# Patient Record
Sex: Male | Born: 1965 | Race: Black or African American | Hispanic: No | Marital: Married | Smoking: Never smoker
Health system: Southern US, Community
[De-identification: ages and names within clinical notes are randomized; demographics above are authoritative.]

## PROBLEM LIST (undated history)

## (undated) DIAGNOSIS — T7840XA Allergy, unspecified, initial encounter: Secondary | ICD-10-CM

## (undated) DIAGNOSIS — J329 Chronic sinusitis, unspecified: Secondary | ICD-10-CM

## (undated) HISTORY — PX: ORCHIOPEXY: SHX479

## (undated) HISTORY — DX: Allergy, unspecified, initial encounter: T78.40XA

## (undated) HISTORY — DX: Chronic sinusitis, unspecified: J32.9

---

## 2007-05-14 ENCOUNTER — Emergency Department (HOSPITAL_COMMUNITY): Admission: EM | Admit: 2007-05-14 | Discharge: 2007-05-14 | Payer: Self-pay | Admitting: Emergency Medicine

## 2011-04-21 ENCOUNTER — Ambulatory Visit (INDEPENDENT_AMBULATORY_CARE_PROVIDER_SITE_OTHER): Payer: 59 | Admitting: Family Medicine

## 2011-04-21 DIAGNOSIS — J309 Allergic rhinitis, unspecified: Secondary | ICD-10-CM

## 2011-04-21 DIAGNOSIS — J329 Chronic sinusitis, unspecified: Secondary | ICD-10-CM

## 2011-04-21 MED ORDER — IPRATROPIUM BROMIDE 0.03 % NA SOLN: 2.0000 | Freq: Two times a day (BID) | NASAL | Status: DC

## 2011-04-21 NOTE — Progress Notes (Signed)
  [facility]:  Office Visit  Chief Complaint:  Chief Complaint  Patient presents with  . Sore Throat    symptoms since thurs  . Cough    dry  . Headache    HPI: Michael Barry is a 46 y.o. male who complains of 3 day history of  HA, sinus congestion, sore throat, rhinorrhea, cough is clear mucus,  took Zyrtec without relief.  Past Medical History  Diagnosis Date  . Sinus infection    History reviewed. No pertinent past surgical history. History   Social History  . Marital Status: Legally Separated    Spouse Name: N/A    Number of Children: N/A  . Years of Education: N/A   Social History Main Topics  . Smoking status: Never Smoker   . Smokeless tobacco: None  . Alcohol Use: No  . Drug Use: No  . Sexually Active: None   Other Topics Concern  . None   Social History Narrative  . None   Family History  Problem Relation Age of Onset  . Diabetes Mother    No Known Allergies Prior to Admission medications   Medication Sig Start Date End Date Taking? Authorizing Provider  cetirizine (ZYRTEC) 10 MG tablet Take 10 mg by mouth daily.   Yes Historical Provider, MD     ROS: The patient denies fevers, chills, night sweats, unintentional weight loss, chest pain, palpitations, wheezing, dyspnea on exertion, nausea, vomiting, abdominal pain, dysuria, hematuria, melena, numbness, weakness, or tingling. + cough, sinus HA  All other systems have been reviewed and were otherwise negative with the exception of those mentioned in the HPI and as above.    PHYSICAL EXAM: Filed Vitals:   04/21/11 1032  BP: 124/80  Pulse: 78  Temp: 98.2 F (36.8 C)  Resp: 16   Filed Vitals:   04/21/11 1032  Height: 5' 4.5" (1.638 m)  Weight: 164 lb 9.6 oz (74.662 kg)   Body mass index is 27.82 kg/(m^2).  General: Alert, no acute distress HEENT:  Normocephalic, atraumatic, oropharynx patent. TM nl. + sinus tenderness frontal bilateral, nose boggy red inflamed.    Cardiovascular:  Regular rate and rhythm, no rubs murmurs or gallops.  No Carotid bruits, radial pulse intact. No pedal edema.  Respiratory: Clear to auscultation bilaterally.  No wheezes, rales, or rhonchi.  No cyanosis, no use of accessory musculature GI: No organomegaly, abdomen is soft and non-tender, positive bowel sounds.  No masses. Skin: No rashes. Neurologic: Facial musculature symmetric. Psychiatric: Patient is appropriate throughout our interaction. Lymphatic: No cervical lymphadenopathy Musculoskeletal: Gait intact.   LABS: No results found for this or any previous visit.   EKG/XRAY:   Primary read interpreted by Dr. Conley Rolls at [facility].   ASSESSMENT/PLAN: Encounter Diagnoses  Name Primary?  . Allergic rhinitis Yes  . Sinusitis    Atrovent NS C/w Zyrtec Gave a rx for Z-pack. Advise not to take until 5 days from now if sxs worsen. Fill date is for 04/26/11. He was given this due to h/o sinus infection.    Shantea Poulton PHUONG, DO 04/21/2011 11:21 AM

## 2011-04-27 ENCOUNTER — Ambulatory Visit (INDEPENDENT_AMBULATORY_CARE_PROVIDER_SITE_OTHER): Payer: 59 | Admitting: Family Medicine

## 2011-04-27 DIAGNOSIS — J329 Chronic sinusitis, unspecified: Secondary | ICD-10-CM

## 2011-04-27 DIAGNOSIS — J301 Allergic rhinitis due to pollen: Secondary | ICD-10-CM

## 2011-04-27 DIAGNOSIS — J019 Acute sinusitis, unspecified: Secondary | ICD-10-CM

## 2011-04-27 DIAGNOSIS — H1010 Acute atopic conjunctivitis, unspecified eye: Secondary | ICD-10-CM

## 2011-04-27 DIAGNOSIS — L309 Dermatitis, unspecified: Secondary | ICD-10-CM

## 2011-04-27 DIAGNOSIS — H1045 Other chronic allergic conjunctivitis: Secondary | ICD-10-CM

## 2011-04-27 DIAGNOSIS — L259 Unspecified contact dermatitis, unspecified cause: Secondary | ICD-10-CM

## 2011-04-27 MED ORDER — TRIAMCINOLONE ACETONIDE 0.1 % EX CREA: TOPICAL_CREAM | Freq: Two times a day (BID) | CUTANEOUS | Status: AC

## 2011-04-27 MED ORDER — METHYLPREDNISOLONE ACETATE 80 MG/ML IJ SUSP
80.0000 mg | Freq: Once | INTRAMUSCULAR | Status: AC
  Administered 2011-04-27: 80 mg via INTRAMUSCULAR

## 2011-04-27 MED ORDER — LEVOFLOXACIN 500 MG PO TABS: 500.0000 mg | ORAL_TABLET | Freq: Every day | ORAL | Status: AC

## 2011-04-27 NOTE — Progress Notes (Signed)
  Subjective:    Patient ID: Michael Barry, male    DOB: 10-01-1965, 46 y.o.   MRN: [id number]  HPI  Patient presents complaining of  Itchy eyes Nasal stuffiness (L) sided facial pain and swelling Pruritic rash on his neck  Atrovent nasal spray no longer effective  Review of Systems  Constitutional: Negative for fever and chills.  HENT: Positive for congestion. Negative for ear pain.   Respiratory: Positive for cough. Negative for wheezing.   Skin: Positive for rash.       Objective:   Physical Exam  Constitutional: He appears well-developed.  HENT:  Nose: Mucosal edema (turbinates swollen and essentially occlude nasal passage way) present. Left sinus exhibits maxillary sinus tenderness and frontal sinus tenderness.  Cardiovascular: Normal rate, regular rhythm and normal heart sounds.   Pulmonary/Chest: Effort normal and breath sounds normal.  Lymphadenopathy:    He has cervical adenopathy.  Neurological: He is alert.  Skin: Skin is warm. Rash (eczematous hyperpigmented area posterior nape) noted.          Assessment & Plan:   1. Allergic rhinitis  methylPREDNISolone acetate (DEPO-MEDROL) injection 80 mg  2. Conjunctivitis, allergic    3. Sinusitis  levofloxacin (LEVAQUIN) 500 MG tablet  4. Eczema  triamcinolone cream (KENALOG) 0.1 %

## 2011-10-26 ENCOUNTER — Ambulatory Visit (INDEPENDENT_AMBULATORY_CARE_PROVIDER_SITE_OTHER): Payer: 59 | Admitting: Family Medicine

## 2011-10-26 VITALS — BP 112/74 | HR 69 | Temp 97.6°F | Resp 16 | Ht 64.0 in | Wt 163.0 lb

## 2011-10-26 DIAGNOSIS — J309 Allergic rhinitis, unspecified: Secondary | ICD-10-CM

## 2011-10-26 DIAGNOSIS — J3489 Other specified disorders of nose and nasal sinuses: Secondary | ICD-10-CM

## 2011-10-26 MED ORDER — CEFDINIR 300 MG PO CAPS: 300.0000 mg | ORAL_CAPSULE | Freq: Two times a day (BID) | ORAL | Status: DC

## 2011-10-26 MED ORDER — METHYLPREDNISOLONE ACETATE 80 MG/ML IJ SUSP
80.0000 mg | Freq: Once | INTRAMUSCULAR | Status: AC
  Administered 2011-10-26: 80 mg via INTRAMUSCULAR

## 2011-10-26 NOTE — Progress Notes (Signed)
[facility] [address] [place] [address] [phone]  Date:  10/26/2011   Name:  Michael Barry   DOB:  1965-06-01   MRN:  [id number]  PCP:  No primary provider on file.    Chief Complaint: Sinusitis   History of Present Illness:  Michael Barry is a 46 y.o. very pleasant male patient who presents with the following:  He is here today with a concern about his sinuses.  He notes that his nose is running, and he has congestion and pain in his sinuses. He has had this problem for the last 2 or 3 days  He has a mild cough.   No fever, chills or body aches.   He does note a ST but no earache.  No GI symptoms.   He tends to have this problem "twice a year."  He is using zyrtec but is not getting relief.   He was helped by a shot of steroids in the spring of this year- it helped " a lot."  He would like to have this again if possible.   Otherwise he is generally healthy and does not take any chronic medications  There is no problem list on file for this patient.   Past Medical History  Diagnosis Date  . Sinus infection   . Allergy     No past surgical history on file.  History  Substance Use Topics  . Smoking status: Never Smoker   . Smokeless tobacco: Not on file  . Alcohol Use: No    Family History  Problem Relation Age of Onset  . Diabetes Mother     No Known Allergies  Medication list has been reviewed and updated.  Current Outpatient Prescriptions on File Prior to Visit  Medication Sig Dispense Refill  . cetirizine (ZYRTEC) 10 MG tablet Take 10 mg by mouth daily.      [name] ipratropium (ATROVENT) 0.03 % nasal spray Place 2 sprays into the nose every 12 (twelve) hours.  30 mL  1  . triamcinolone cream (KENALOG) 0.1 % Apply topically 2 (two) times daily.  30 g  0    Review of Systems:  As per HPI- otherwise negative.   Physical Examination: Filed Vitals:   10/26/11 1407  BP: 112/74  Pulse: 69  Temp: 97.6 F (36.4 C)  Resp:  16   Filed Vitals:   10/26/11 1407  Height: 5\' 4"  (1.626 m)  Weight: 163 lb (73.936 kg)   Body mass index is 27.98 kg/(m^2). Ideal Body Weight: Weight in (lb) to have BMI = 25: 145.3   GEN: WDWN, NAD, Non-toxic, A & O x 3 HEENT: Atraumatic, Normocephalic. Neck supple. No masses, No LAD.  Bilateral TM wnl, oropharynx normal.  PEERL,EOMI.   His left nasal cavity is occluded by swollen turbinates and congestion.  Right nasal cavity is congested.   Ears and Nose: No external deformity. CV: RRR, No M/G/R. No JVD. No thrill. No extra heart sounds. PULM: CTA B, no wheezes, crackles, rhonchi. No retractions. No resp. distress. No accessory muscle use. ABD: S, NT, ND EXTR: No c/c/e NEURO Normal gait.  PSYCH: Normally interactive. Conversant. Not depressed or anxious appearing.  Calm demeanor.    Assessment and Plan: 1. Allergic rhinitis  methylPREDNISolone acetate (DEPO-MEDROL) injection 80 mg  2. Sinus pain  cefdinir (OMNICEF) 300 MG capsule   Likely severe AR- he has responded well to depo- medrol in the past and his symptoms are severe, so will  give him a shot of this today Hold omnicef as I suspect the he does not have an infection- however, he can start the antibiotic if not better in the next few days.  See pt instructions.    Abbe Amsterdam, MD

## 2011-10-26 NOTE — Patient Instructions (Addendum)
I think that you have allergies, not an infection.  However, if you are not better in the next 2 or 3 days fill and use the [facility] prescription.  Let me know if you are still not better after that- Sooner if worse.

## 2012-04-23 ENCOUNTER — Ambulatory Visit (INDEPENDENT_AMBULATORY_CARE_PROVIDER_SITE_OTHER): Payer: 59 | Admitting: Family Medicine

## 2012-04-23 VITALS — BP 122/80 | HR 68 | Temp 97.7°F | Resp 16 | Ht 66.0 in | Wt 162.0 lb

## 2012-04-23 DIAGNOSIS — J309 Allergic rhinitis, unspecified: Secondary | ICD-10-CM

## 2012-04-23 MED ORDER — METHYLPREDNISOLONE ACETATE 80 MG/ML IJ SUSP
80.0000 mg | Freq: Once | INTRAMUSCULAR | Status: AC
  Administered 2012-04-23: 80 mg via INTRAMUSCULAR

## 2012-04-23 MED ORDER — FLUTICASONE PROPIONATE 50 MCG/ACT NA SUSP: 2.0000 | Freq: Every day | NASAL | Status: DC

## 2012-04-23 NOTE — Patient Instructions (Addendum)
We are going to refer you to an allergist for further care of your chronic allergies.  You can buy some more zyrtec OTC and take one a day.  Use the nasal spray once a day as well.  Let me know if you are not better in the next few days.

## 2012-04-23 NOTE — Progress Notes (Signed)
[facility] [address] [place] [address] [phone]  Date:  04/23/2012   Name:  Michael Barry   DOB:  19-Apr-1965   MRN:  [id number]  PCP:  No primary provider on file.    Chief Complaint: Allergies   History of Present Illness:  Michael Barry is a 47 y.o. very pleasant male patient who presents with the following:  Seen around this time last year with allergies which were non- responsive to Atrovent NS.  He was treated with IM depo- medrol.  Was here again in the fall and received the same thing. He always get a good response with the depo- medrol.   He is here today with "the same thing every year, but this year it's the worst."   He has noted symptoms for about 4 days.  He has a lot of nasal and sinus congestion, and he feels a lot of pressure in his sinuses. He has pain in his sinuses.  In the morning his eyes are very swollen.  He feels terrible.   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   + sneezing No cough He has not noted a fever, chills, body aches No GI symptoms.     He is otherwise generally healthy There is no problem list on file for this patient.   Past Medical History  Diagnosis Date  . Sinus infection   . Allergy     History reviewed. No pertinent past surgical history.  History  Substance Use Topics  . Smoking status: Never Smoker   . Smokeless tobacco: Not on file  . Alcohol Use: No    Family History  Problem Relation Age of Onset  . Diabetes Mother     No Known Allergies  Medication list has been reviewed and updated.  Current Outpatient Prescriptions on File Prior to Visit  Medication Sig Dispense Refill  . cetirizine (ZYRTEC) 10 MG tablet Take 10 mg by mouth daily.      . cefdinir (OMNICEF) 300 MG capsule Take 1 capsule (300 mg total) by mouth 2 (two) times daily.  20 capsule  0  . ipratropium (ATROVENT) 0.03 % nasal spray Place 2 sprays into the nose every 12 (twelve)  hours.  30 mL  1  . triamcinolone cream (KENALOG) 0.1 % Apply topically 2 (two) times daily.  30 g  0   No current facility-administered medications on file prior to visit.    Review of Systems:  As per HPI- otherwise negative.   Physical Examination: Filed Vitals:   04/23/12 0803  BP: 122/80  Pulse: 68  Temp: 97.7 F (36.5 C)  Resp: 16   Filed Vitals:   04/23/12 0803  Height: 5\' 6"  (1.676 m)  Weight: 162 lb (73.483 kg)   Body mass index is 26.16 kg/(m^2). Ideal Body Weight: Weight in (lb) to have BMI = 25: 154.6  GEN: WDWN, NAD, Non-toxic, A & O x 3 HEENT: Atraumatic, Normocephalic. Neck supple. No masses, No LAD.  Bilateral TM wnl, oropharynx normal.  PEERL,EOMI.   Nasal cavity is congested Ears and Nose: No external deformity. CV: RRR, No M/G/R. No JVD. No thrill. No extra heart sounds. PULM: CTA B, no wheezes, crackles, rhonchi. No retractions. No resp. distress. No accessory muscle use. EXTR: No c/c/e NEURO Normal gait.  PSYCH: Normally interactive. Conversant. Not depressed or anxious appearing.  Calm demeanor.    Assessment and Plan: Allergic rhinitis - Plan:  fluticasone (FLONASE) 50 MCG/ACT nasal spray, methylPREDNISolone acetate (DEPO-MEDROL) injection 80 mg, Ambulatory referral to Allergy  Referral to allergy- as we would like to avoid having to use depo- medrol twice a year.  He would like to see an allergist.  In the meantime will use zyrtec and flonase, let me know if not better sppm  Signed Abbe Amsterdam, MD

## 2012-05-30 ENCOUNTER — Ambulatory Visit (INDEPENDENT_AMBULATORY_CARE_PROVIDER_SITE_OTHER): Payer: 59 | Admitting: Family Medicine

## 2012-05-30 VITALS — BP 124/74 | HR 83 | Temp 98.0°F | Resp 16 | Ht 64.25 in | Wt 160.6 lb

## 2012-05-30 DIAGNOSIS — Z111 Encounter for screening for respiratory tuberculosis: Secondary | ICD-10-CM

## 2012-05-30 DIAGNOSIS — Z0000 Encounter for general adult medical examination without abnormal findings: Secondary | ICD-10-CM

## 2012-05-30 DIAGNOSIS — Z8042 Family history of malignant neoplasm of prostate: Secondary | ICD-10-CM

## 2012-05-30 LAB — POCT CBC
Granulocyte percent: 50.2 %G (ref 37–80)
HCT, POC: 47.7 % (ref 43.5–53.7)
Hemoglobin: 15 g/dL (ref 14.1–18.1)
Lymph, poc: 2.2 (ref 0.6–3.4)
MCH, POC: 25.7 pg — AB (ref 27–31.2)
MCHC: 31.4 g/dL — AB (ref 31.8–35.4)
MCV: 81.8 fL (ref 80–97)
MID (cbc): 0.3 (ref 0–0.9)
MPV: 8.1 fL (ref 0–99.8)
POC Granulocyte: 2.6 (ref 2–6.9)
POC LYMPH PERCENT: 43.3 %L (ref 10–50)
POC MID %: 6.5 % (ref 0–12)
Platelet Count, POC: 193 10*3/uL (ref 142–424)
RBC: 5.83 M/uL (ref 4.69–6.13)
RDW, POC: 15.3 %
WBC: 5.1 10*3/uL (ref 4.6–10.2)

## 2012-05-30 LAB — COMPREHENSIVE METABOLIC PANEL
AST: 20 U/L (ref 0–37)
Albumin: 4.4 g/dL (ref 3.5–5.2)
Alkaline Phosphatase: 62 U/L (ref 39–117)
Calcium: 9.5 mg/dL (ref 8.4–10.5)
Chloride: 105 mEq/L (ref 96–112)
Glucose, Bld: 69 mg/dL — ABNORMAL LOW (ref 70–99)
Potassium: 4 mEq/L (ref 3.5–5.3)
Sodium: 137 mEq/L (ref 135–145)
Total Protein: 7.1 g/dL (ref 6.0–8.3)

## 2012-05-30 LAB — COMPREHENSIVE METABOLIC PANEL WITH GFR
ALT: 32 U/L (ref 0–53)
BUN: 8 mg/dL (ref 6–23)
CO2: 26 meq/L (ref 19–32)
Creat: 0.85 mg/dL (ref 0.50–1.35)
Total Bilirubin: 0.4 mg/dL (ref 0.3–1.2)

## 2012-05-30 LAB — LDL CHOLESTEROL, DIRECT: Direct LDL: 65 mg/dL

## 2012-05-30 NOTE — Progress Notes (Signed)
[facility]:  Office Visit  Chief Complaint:  Chief Complaint  Patient presents with  . Annual Exam    HPI: Michael Barry is a 47 y.o. male who complains of  Here for and TB test. 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  He is from [place] He was a HS [place] No complaints Last PE-? Does not do regular testicular  Father with prostate cancer at age 81, died at age 47   Past Medical History  Diagnosis Date  . Sinus infection   . Allergy    No past surgical history on file. History   Social History  . Marital Status: Married    Spouse Name: N/A    Number of Children: N/A  . Years of Education: N/A   Social History Main Topics  . Smoking status: Never Smoker   . Smokeless tobacco: None  . Alcohol Use: No  . Drug Use: No  . Sexually Active: Yes    Birth Control/ Protection: None   Other Topics Concern  . None   Social History Narrative  . None   Family History  Problem Relation Age of Onset  . Diabetes Mother    No Known Allergies Prior to Admission medications   Medication Sig Start Date End Date Taking? Authorizing Provider  cetirizine (ZYRTEC) 10 MG tablet Take 10 mg by mouth daily.   Yes Historical Provider, MD  fluticasone (FLONASE) 50 MCG/ACT nasal spray Place 2 sprays into the nose daily. 04/23/12  Yes Jessica C Copland, MD  ipratropium (ATROVENT) 0.03 % nasal spray Place 2 sprays into the nose every 12 (twelve) hours. 04/21/11 04/20/12  Thao P Le, DO     ROS: The patient denies fevers, chills, night sweats, unintentional weight loss, chest pain, palpitations, wheezing, dyspnea on exertion, nausea, vomiting, abdominal pain, dysuria, hematuria, melena, numbness, weakness, or tingling.   All other systems have been reviewed and were otherwise negative with the exception of those mentioned in the HPI and as above.    PHYSICAL EXAM: Filed Vitals:   05/30/12 1424  BP: 124/74  Pulse: 83  Temp: 98 F (36.7 C)  Resp: 16   Filed  Vitals:   05/30/12 1424  Height: 5' 4.25" (1.632 m)  Weight: 160 lb 9.6 oz (72.848 kg)   Body mass index is 27.35 kg/(m^2).  General: Alert, no acute distress HEENT:  Normocephalic, atraumatic, oropharynx patent. EOMI, PERRLA, fundoscopic exam nl Cardiovascular:  Regular rate and rhythm, no rubs murmurs or gallops.  No Carotid bruits, radial pulse intact. No pedal edema.  Respiratory: Clear to auscultation bilaterally.  No wheezes, rales, or rhonchi.  No cyanosis, no use of accessory musculature GI: No organomegaly, abdomen is soft and non-tender, positive bowel sounds.  No masses. Skin: No rashes. Neurologic: Facial musculature symmetric. Psychiatric: Patient is appropriate throughout our interaction. Lymphatic: No cervical lymphadenopathy Musculoskeletal: Gait intact. 5/5 strength, no scoliosis, 2/2 DTrs sensation intact.  GU-no inguinal hernia, testicles normal.    LABS: Results for orders placed in visit on 05/30/12  POCT CBC      Result Value Range   WBC 5.1  4.6 - 10.2 K/uL   Lymph, poc 2.2  0.6 - 3.4   POC LYMPH PERCENT 43.3  10 - 50 %L   MID (cbc) 0.3  0 - 0.9   POC MID % 6.5  0 - 12 %M   POC Granulocyte 2.6  2 - 6.9   Granulocyte percent 50.2  37 - 80 %  G   RBC 5.83  4.69 - 6.13 M/uL   Hemoglobin 15.0  14.1 - 18.1 g/dL   HCT, POC 16.1  09.6 - 53.7 %   MCV 81.8  80 - 97 fL   MCH, POC 25.7 (*) 27 - 31.2 pg   MCHC 31.4 (*) 31.8 - 35.4 g/dL   RDW, POC 04.5     Platelet Count, POC 193  142 - 424 K/uL   MPV 8.1  0 - 99.8 fL     EKG/XRAY:   Primary read interpreted by Dr. Conley Rolls at [facility].   ASSESSMENT/PLAN: Encounter Diagnoses  Name Primary?  . Annual physical exam Yes  . Family history of prostate cancer   . PPD screening test    Labs pending Patient will get Interferon Gold since has had BCG vaccine F/u prn    LE, THAO PHUONG, DO 05/30/2012 3:39 PM

## 2012-05-31 LAB — PSA: PSA: 0.65 ng/mL (ref <=4.00)

## 2012-06-02 ENCOUNTER — Telehealth: Payer: Self-pay | Admitting: Family Medicine

## 2012-06-02 ENCOUNTER — Encounter (INDEPENDENT_AMBULATORY_CARE_PROVIDER_SITE_OTHER): Payer: 59

## 2012-06-02 DIAGNOSIS — B330 Epidemic myalgia: Secondary | ICD-10-CM

## 2012-06-02 DIAGNOSIS — R899 Unspecified abnormal finding in specimens from other organs, systems and tissues: Secondary | ICD-10-CM

## 2012-06-02 LAB — QUANTIFERON TB GOLD ASSAY (BLOOD)
Interferon Gamma Release Assay: POSITIVE — AB
Mitogen value: 9.28 [IU]/mL
Quantiferon Nil Value: 0.16 [IU]/mL
Quantiferon Tb Ag Minus Nil Value: 1.68 [IU]/mL
TB Ag value: 1.84 [IU]/mL

## 2012-06-02 NOTE — Telephone Encounter (Signed)
Lm regarding Interferon gold test. Will get chest xray on patient. He will come in for just that, He denies any symptoms.

## 2012-06-02 NOTE — Telephone Encounter (Addendum)
Discussed lab results with patient, interferon gold pending.Michael KitchenMarland KitchenBut  Test result came back +, he is completely asymptomatic: no fevers, chills, weightloss, nightsweats, cough, recent travels He was vaccinated with BCG in his home country. He did not tell me that he was treated for TB on our OV He was treated for TB in 2002 or 2003 at the [facility] I have advised him to return for Chest xray only, that has been ordered. He will get me the records from the Health Departement.

## 2012-06-03 ENCOUNTER — Ambulatory Visit (INDEPENDENT_AMBULATORY_CARE_PROVIDER_SITE_OTHER): Payer: 59 | Admitting: Internal Medicine

## 2012-06-03 ENCOUNTER — Ambulatory Visit: Payer: 59

## 2012-06-03 VITALS — BP 120/85 | HR 77 | Temp 98.2°F | Resp 18 | Wt 159.0 lb

## 2012-06-03 DIAGNOSIS — Z111 Encounter for screening for respiratory tuberculosis: Secondary | ICD-10-CM

## 2012-06-03 DIAGNOSIS — R899 Unspecified abnormal finding in specimens from other organs, systems and tissues: Secondary | ICD-10-CM

## 2012-06-03 DIAGNOSIS — R6889 Other general symptoms and signs: Secondary | ICD-10-CM

## 2012-06-03 NOTE — Patient Instructions (Addendum)
Your chest x-ray looks ok today.  I will send you a copy of the radiology report when it is available.  Please bring [place] copies of your records for the health department.

## 2012-06-03 NOTE — Progress Notes (Signed)
TB SCREENING in patient with history of POSITIVE reaction to TB Skin Test  Has the patient experienced any of the following in the last 12 months? Unexplained productive cough no Unexplained weight loss no Unexplained appetite loss no Unexplained fever no  Night sweats no Shortness of breath no   Chest pain no Increased fatigue no Exposure to Tuberculosis no Development of Diabetes no 

## 2012-06-03 NOTE — Progress Notes (Signed)
  Subjective:    Patient ID: Michael Barry, male    DOB: Mar 24, 1965, 47 y.o.   MRN: [id number]    HPI   Michael Barry is a pleasant 47 yr old male here for CXR.  Previous notes reviewed.  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  At last visit pt reported a history BCG vaccine as a child, and therefore TB quantiferon gold was ordered, which subsequently came back positive.  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  He is asymptomatic - denies fever, chills, weight loss, night sweats, cough, SOB.   Review of Systems  All other systems reviewed and are negative.       Objective:   Physical Exam  Vitals reviewed. Constitutional: He is oriented to person, place, and time. He appears well-developed and well-nourished. No distress.  HENT:  Head: Normocephalic and atraumatic.  Eyes: Conjunctivae are normal. No scleral icterus.  Pulmonary/Chest: Effort normal.  Neurological: He is alert and oriented to person, place, and time.  Skin: Skin is warm and dry.  Psychiatric: He has a normal mood and affect. His behavior is normal.     [facility] reading (PRIMARY) by  Dr. Perrin Maltese - negative       Assessment & Plan:  Screening for tuberculosis - Plan: DG Chest 2 View  Abnormal laboratory test - Plan: DG Chest 2 View, DG Chest 2 View   Michael Barry is a 47 yr old male here requiring CXR for TB screening.  He has a history of BCG vaccine, also treated for LTBI, now with positive quantiferon gold.  Manufacturer information for quantiferon gold indicates that the test may remain positive after treatment for LTBI, which may account for pt's positive result.  CXR today is negative.  Await radiology final report, then will print copy for pt's records.  Encouraged him to obtain a copy of his records from the health department for our review.

## 2012-06-06 NOTE — Telephone Encounter (Signed)
Dr,Lee, Pt states that he was told to bring results from tx for tb 2003, pt would like to discuss this with you.   best# [phone]

## 2012-06-06 NOTE — Telephone Encounter (Signed)
Spoke with patient, reviewed records from [facility]. 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 Scanned paper from HD into system. I have written a letter stating he has no active or latent TB at this time. CXR results are negtive for active diseases as well.

## 2013-07-03 ENCOUNTER — Ambulatory Visit (INDEPENDENT_AMBULATORY_CARE_PROVIDER_SITE_OTHER): Payer: 59 | Admitting: Physician Assistant

## 2013-07-03 VITALS — BP 120/72 | HR 72 | Temp 97.9°F | Resp 16 | Ht 63.0 in | Wt 159.4 lb

## 2013-07-03 DIAGNOSIS — Z113 Encounter for screening for infections with a predominantly sexual mode of transmission: Secondary | ICD-10-CM

## 2013-07-03 NOTE — Progress Notes (Signed)
   Subjective:    Patient ID: Michael Barry, male    DOB: May 06, 1965, 48 y.o.   MRN: [id number]   PCP: No PCP Per Patient  Chief Complaint  Patient presents with  . Exposure to STD    wants to be tested for herpes    Medications, allergies, past medical history, surgical history, family history, social history and problem list reviewed and updated.  HPI  Wife was recently diagnoses with HSV-2 by PCR of swab of a vaginal lesion. He would like to be tested as well.  He has no lesions now, and denies history of any bumps or blisters or ulcers in the genital region. He denies any other sexual partners.   Review of Systems     Objective:   Physical Exam  BP 120/72  Pulse 72  Temp(Src) 97.9 F (36.6 C) (Oral)  Resp 16  Ht 5\' 3"  (1.6 m)  Wt 159 lb 6.4 oz (72.303 kg)  BMI 28.24 kg/m2  SpO2 99% WDWNBM, A&O x 3.      Assessment & Plan:  1. Screening for STD (sexually transmitted disease) Anticipatory guidance. Await lab results. - HSV(herpes simplex vrs) 1+2 ab-IgG   Fernande Brashelle S. Ismahan Lippman, PA-C Physician Assistant-Certified [facility]

## 2013-07-03 NOTE — Patient Instructions (Signed)
I will contact you with your lab results as soon as they are available.   If you have not heard from me in 2 weeks, please contact me.  The fastest way to get your results is to register for My Chart (see the instructions on the last page of this printout).   

## 2013-07-06 LAB — HSV(HERPES SIMPLEX VRS) I + II AB-IGG
HSV 1 GLYCOPROTEIN G AB, IGG: 6.05 IV — AB
HSV 2 Glycoprotein G Ab, IgG: 2.91 IV — ABNORMAL HIGH

## 2013-07-07 ENCOUNTER — Encounter: Payer: Self-pay | Admitting: Physician Assistant

## 2013-07-07 DIAGNOSIS — R768 Other specified abnormal immunological findings in serum: Secondary | ICD-10-CM | POA: Insufficient documentation

## 2013-07-07 DIAGNOSIS — R7689 Other specified abnormal immunological findings in serum: Secondary | ICD-10-CM | POA: Insufficient documentation

## 2013-07-10 ENCOUNTER — Other Ambulatory Visit: Payer: Self-pay | Admitting: Physician Assistant

## 2013-07-10 DIAGNOSIS — R768 Other specified abnormal immunological findings in serum: Secondary | ICD-10-CM

## 2013-07-10 MED ORDER — VALACYCLOVIR HCL 1 G PO TABS: 500.0000 mg | ORAL_TABLET | Freq: Every day | ORAL | Status: DC

## 2013-07-13 NOTE — Progress Notes (Signed)
See labs. Pt notified.  

## 2013-09-24 ENCOUNTER — Ambulatory Visit (INDEPENDENT_AMBULATORY_CARE_PROVIDER_SITE_OTHER): Payer: 59 | Admitting: Emergency Medicine

## 2013-09-24 VITALS — BP 120/72 | HR 72 | Temp 97.7°F | Resp 18 | Ht 64.0 in | Wt 160.0 lb

## 2013-09-24 DIAGNOSIS — R768 Other specified abnormal immunological findings in serum: Secondary | ICD-10-CM

## 2013-09-24 DIAGNOSIS — Z23 Encounter for immunization: Secondary | ICD-10-CM

## 2013-09-24 DIAGNOSIS — R894 Abnormal immunological findings in specimens from other organs, systems and tissues: Secondary | ICD-10-CM

## 2013-09-24 DIAGNOSIS — B009 Herpesviral infection, unspecified: Secondary | ICD-10-CM

## 2013-09-24 DIAGNOSIS — Z0000 Encounter for general adult medical examination without abnormal findings: Secondary | ICD-10-CM

## 2013-09-24 DIAGNOSIS — J309 Allergic rhinitis, unspecified: Secondary | ICD-10-CM

## 2013-09-24 LAB — POCT URINALYSIS DIPSTICK
Bilirubin, UA: NEGATIVE
Blood, UA: NEGATIVE
Glucose, UA: NEGATIVE
Ketones, UA: NEGATIVE
LEUKOCYTES UA: NEGATIVE
Nitrite, UA: NEGATIVE
PH UA: 5.5
PROTEIN UA: NEGATIVE
Spec Grav, UA: 1.025
UROBILINOGEN UA: 0.2

## 2013-09-24 LAB — LIPID PANEL
CHOL/HDL RATIO: 3.5 ratio
CHOLESTEROL: 160 mg/dL (ref 0–200)
HDL: 46 mg/dL (ref >39)
LDL CALC: 88 mg/dL (ref 0–99)
TRIGLYCERIDES: 130 mg/dL (ref <150)
VLDL: 26 mg/dL (ref 0–40)

## 2013-09-24 LAB — POCT CBC
GRANULOCYTE PERCENT: 46.4 % (ref 37–80)
HCT, POC: 48.2 % (ref 43.5–53.7)
Hemoglobin: 15.6 g/dL (ref 14.1–18.1)
LYMPH, POC: 2.4 (ref 0.6–3.4)
MCH, POC: 25.2 pg — AB (ref 27–31.2)
MCHC: 32.5 g/dL (ref 31.8–35.4)
MCV: 77.5 fL — AB (ref 80–97)
MID (CBC): 0.1 (ref 0–0.9)
MPV: 7.7 fL (ref 0–99.8)
PLATELET COUNT, POC: 181 10*3/uL (ref 142–424)
POC GRANULOCYTE: 2.2 (ref 2–6.9)
POC LYMPH %: 50.8 % — AB (ref 10–50)
POC MID %: 2.8 % (ref 0–12)
RBC: 6.21 M/uL — AB (ref 4.69–6.13)
RDW, POC: 15.5 %
WBC: 4.7 10*3/uL (ref 4.6–10.2)

## 2013-09-24 LAB — COMPREHENSIVE METABOLIC PANEL
ALK PHOS: 68 U/L (ref 39–117)
ALT: 35 U/L (ref 0–53)
AST: 29 U/L (ref 0–37)
Albumin: 4.4 g/dL (ref 3.5–5.2)
BILIRUBIN TOTAL: 0.5 mg/dL (ref 0.2–1.2)
BUN: 9 mg/dL (ref 6–23)
CO2: 31 meq/L (ref 19–32)
CREATININE: 0.93 mg/dL (ref 0.50–1.35)
Calcium: 9.5 mg/dL (ref 8.4–10.5)
Chloride: 99 mEq/L (ref 96–112)
Glucose, Bld: 100 mg/dL — ABNORMAL HIGH (ref 70–99)
Potassium: 4.2 mEq/L (ref 3.5–5.3)
SODIUM: 139 meq/L (ref 135–145)
TOTAL PROTEIN: 7.9 g/dL (ref 6.0–8.3)

## 2013-09-24 MED ORDER — VALACYCLOVIR HCL 1 G PO TABS: 500.0000 mg | ORAL_TABLET | Freq: Every day | ORAL | Status: AC

## 2013-09-24 MED ORDER — CETIRIZINE HCL 10 MG PO TABS: 10.0000 mg | ORAL_TABLET | Freq: Every day | ORAL | Status: DC

## 2013-09-24 MED ORDER — FLUTICASONE PROPIONATE 50 MCG/ACT NA SUSP: 2.0000 | Freq: Every day | NASAL | Status: DC

## 2013-09-24 MED ORDER — IPRATROPIUM BROMIDE 0.03 % NA SOLN: 2.0000 | Freq: Two times a day (BID) | NASAL | Status: DC

## 2013-09-24 NOTE — Patient Instructions (Signed)

## 2013-09-24 NOTE — Addendum Note (Signed)
Addended by: Maurene Capes on: 09/24/2013 09:46 AM   Modules accepted: Orders

## 2013-09-24 NOTE — Progress Notes (Signed)
[facility] [address], [place] [address] [phone]  Date:  09/24/2013   Name:  Michael Barry   DOB:  1965/06/13   MRN:  [id number]  PCP:  No PCP Per Patient    Chief Complaint: Annual Exam   History of Present Illness:  Michael Barry is a 48 y.o. very pleasant male patient who presents with the following:  Patient has presented for annual physical.  Has been treated for seasonal allergies and ins intermittently using the nasal sprays and zyrtec.   Some nasal congestion and mucoid drainage currently.   Has no cough, wheeze, or shortness of breath.  modeate watery drainage.   No sore throat or fever or chills.  Never had colonoscopy. Denies other complaint or health concern today.   Patient Active Problem List   Diagnosis Date Noted  . 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 Allergic rhinitis 04/23/2012    Past Medical History  Diagnosis Date  . Sinus infection   . Allergy     Past Surgical History  Procedure Laterality Date  . Orchiopexy Left     History  Substance Use Topics  . Smoking status: Never Smoker   . Smokeless tobacco: Never Used  . Alcohol Use: No    Family History  Problem Relation Age of Onset  . Diabetes Mother     No Known Allergies  Medication list has been reviewed and updated.  Current Outpatient Prescriptions on File Prior to Visit  Medication Sig Dispense Refill  . cetirizine (ZYRTEC) 10 MG tablet Take 10 mg by mouth daily.      . fluticasone (FLONASE) 50 MCG/ACT nasal spray Place 2 sprays into the nose daily.  16 g  6  . ipratropium (ATROVENT) 0.03 % nasal spray Place 2 sprays into the nose every 12 (twelve) hours.  30 mL  1  . valACYclovir (VALTREX) 1000 MG tablet Take 0.5 tablets (500 mg total) by mouth daily.  90 tablet  3   No current facility-administered medications on file prior to visit.    Review of Systems:  As per HPI, otherwise negative.    Physical Examination: Filed Vitals:   09/24/13  0840  BP: 120/72  Pulse: 72  Temp: 97.7 F (36.5 C)  Resp: 18   Filed Vitals:   09/24/13 0840  Height:  (1.626 m)  Weight: 160 lb (72.576 kg)   Body mass index is 27.45 kg/(m^2). Ideal Body Weight: Weight in (lb) to have BMI = 25: 145.3  GEN: WDWN, NAD, Non-toxic, A & O x 3 HEENT: Atraumatic, Normocephalic. Neck supple. No masses, No LAD. Ears and Nose: No external deformity. CV: RRR, No M/G/R. No JVD. No thrill. No extra heart sounds. PULM: CTA B, no wheezes, crackles, rhonchi. No retractions. No resp. distress. No accessory muscle use. ABD: S, NT, ND, +BS. No rebound. No HSM. EXTR: No c/c/e NEURO Normal gait.  PSYCH: Normally interactive. Conversant. Not depressed or anxious appearing.  Calm demeanor.    Assessment and Plan: Wellness examination Colonoscopy SAR meds Labs pending.  Signed,  Phillips Odor, MD

## 2013-09-25 LAB — PSA: PSA: 0.68 ng/mL (ref <=4.00)

## 2013-10-03 ENCOUNTER — Telehealth: Payer: Self-pay | Admitting: *Deleted

## 2013-10-03 NOTE — Telephone Encounter (Signed)
Called Michael Barry this morning at 9:09am, 10/03/2013, and left an voicemail on his mobile number regarding coming in to have lab work done and a flu shot.  Dr. Ouida Sills requested that the patient be called today 10/03/13 to have the patient come for labs that include TB, MMR, Varicella, Tetanus, Hep B and possible more.  Patient was advised to call [facility] to schedule a time to come in.

## 2013-10-05 ENCOUNTER — Ambulatory Visit (INDEPENDENT_AMBULATORY_CARE_PROVIDER_SITE_OTHER): Payer: 59 | Admitting: Family Medicine

## 2013-10-05 ENCOUNTER — Ambulatory Visit (INDEPENDENT_AMBULATORY_CARE_PROVIDER_SITE_OTHER): Payer: 59

## 2013-10-05 VITALS — BP 126/82 | HR 58 | Temp 98.1°F | Resp 16 | Ht 64.0 in | Wt 160.8 lb

## 2013-10-05 DIAGNOSIS — R7611 Nonspecific reaction to tuberculin skin test without active tuberculosis: Secondary | ICD-10-CM

## 2013-10-05 DIAGNOSIS — Z283 Underimmunization status: Secondary | ICD-10-CM

## 2013-10-05 DIAGNOSIS — Z23 Encounter for immunization: Secondary | ICD-10-CM

## 2013-10-05 DIAGNOSIS — Z2839 Other underimmunization status: Secondary | ICD-10-CM

## 2013-10-05 NOTE — Progress Notes (Signed)
Subjective: Patient was here for a physical recently. He apparently brought by a form to be completed, which we cannot locate tonight, and was told to come in he needs some titers drawn. He also needs some immunizations. He [place] BCG [place], and therefore he cannot have a PPD. Previous chest x-ray showed a couple of little granulomas.  Healthy otherwise.  Objective: Not examined  Assessment: Immunization review Previous abnormal chest x-ray  Plan: Chest x-ray for purpose of confirming he does not have TB. This was normal.  [facility] reading (PRIMARY) by  Dr. Magda KielHopper Supple small old granulomas unchanged from previously.  We'll check titers. TDAP.  Decide if he needs vaccinations.  Ask Dr. Dareen PianoAnderson if he knows where the form went.

## 2013-10-05 NOTE — Patient Instructions (Signed)
Call back if you do not hear from us in the next week regarding your results of the blood tests and the form

## 2013-10-06 LAB — HEPATITIS B SURFACE ANTIBODY, QUANTITATIVE: HEPATITIS B-POST: 0 m[IU]/mL

## 2013-10-06 NOTE — Telephone Encounter (Signed)
Dr. Alwyn RenHopper, I filled out as much as I could, his labs are not back yet. This is in your box. Thanks

## 2013-10-06 NOTE — Telephone Encounter (Signed)
I have the form but she has not had the labs and immunizations required.  I will be in the office tomorrow

## 2013-10-06 NOTE — Telephone Encounter (Signed)
[facility] [facility] Completion given to Elease EtienneSara Hansen,    Dr. Alwyn RenHopper performed PE yesterday.

## 2013-10-06 NOTE — Telephone Encounter (Signed)
Patient was in the office 10/05/2013 to follow up on the necessary tests. Clinical staff was unable to locate the form.

## 2013-10-06 NOTE — Telephone Encounter (Signed)
Pt has paperwork that needs to be completed for [employer]. [phone] Dr Dareen PianoAnderson do you have this paperwork? I am calling this morning to request a new document be sent to us.

## 2013-10-07 ENCOUNTER — Telehealth: Payer: Self-pay | Admitting: *Deleted

## 2013-10-07 LAB — MEASLES/MUMPS/RUBELLA IMMUNITY
Mumps IgG: 229 AU/mL — ABNORMAL HIGH (ref <9.00)
RUBELLA: 11.5 {index} — AB (ref <0.90)
Rubeola IgG: 38.5 AU/mL — ABNORMAL HIGH (ref <25.00)

## 2013-10-07 LAB — VARICELLA ZOSTER ANTIBODY, IGG: VARICELLA IGG: 1037 {index} — AB (ref <135.00)

## 2013-10-07 NOTE — Telephone Encounter (Signed)
Pt called to inquire about recent lab results. Doesn't look like they have yet been reviewed. Please advise.

## 2013-10-08 ENCOUNTER — Ambulatory Visit (INDEPENDENT_AMBULATORY_CARE_PROVIDER_SITE_OTHER): Payer: 59 | Admitting: Physician Assistant

## 2013-10-08 VITALS — BP 118/70 | HR 74 | Temp 98.4°F

## 2013-10-08 DIAGNOSIS — Z8611 Personal history of tuberculosis: Secondary | ICD-10-CM

## 2013-10-08 DIAGNOSIS — Z23 Encounter for immunization: Secondary | ICD-10-CM

## 2013-10-08 NOTE — Progress Notes (Signed)
     IDENTIFYING INFORMATION  Michael BarrLaurent Barry / male / Nov 25, 1965 / 48 y.o. / MRN: [id number]  The patient has Allergic rhinitis; HSV-2 seropositive; and History of tuberculosis on his problem list.  SUBJECTIVE  Chief Complaint: had a chief complaint of Immunizations and an additional complaint of Annual Exam.  History of present illness:Patient reports that he is starting school at [employer] and needs his hepatitis B series.  He has a form with him that has already been signed by a medical provider.    Chart reveals that he has a history of TB that has been treated adequately in the past. He denies cough, fever, night sweats.    The allergies, family, surgical, and social history were reviewed by me and exist elsewhere in the encounter.    The patient has a current medication list which includes the following prescription(s): cetirizine, fluticasone, ipratropium, and valacyclovir.  Review of Systems  Constitutional: Negative.   Skin: Negative.     OBJECTIVE  Blood pressure 118/70, pulse 74, temperature 98.4 F (36.9 C).  Physical Exam  Constitutional: He is oriented to person, place, and time and well-developed, well-nourished, and in no distress.  HENT:  Head: Normocephalic.  Eyes: EOM are normal. Pupils are equal, round, and reactive to light.  Pulmonary/Chest: No respiratory distress.  Musculoskeletal: Normal range of motion.  Neurological: He is alert and oriented to person, place, and time.  Skin: Skin is warm and dry.  Psychiatric: Mood, memory, affect and judgment normal.   Chest Rad on 10/05/13  CLINICAL DATA: Positive PPD. Initial encounter. Personal history of  treated tuberculosis.   EXAM: CHEST - 1 VIEW COMPARISON: Two-view chest 06/03/2012.   FINDINGS:  The heart size is normal. Granulomatous changes are stable. The  lungs are otherwise clear. The visualized soft tissues and bony  thorax are unremarkable.   IMPRESSION:  No acute cardiopulmonary disease or  significant interval change.  Electronically Signed  By: Gennette Pachris Mattern M.D.  On: 10/05/2013 20:58   ASSESSMENT & PLAN  Need for hepatitis B vaccination - Plan: Hepatitis B vaccine adult IM. Patient [facility] the first week of November for his second inoculation.    History of tuberculosis: Chart reports that he was treated in adequately in the past. See Radiograph above.  This problem has been added to the problem list.      The patient was instructed to to call or comeback to clinic as needed, or should symptoms warrant.  Deliah BostonMichael Treniyah Lynn, MS, PA-C [facility] 10/08/2013 6:02 PM

## 2013-10-08 NOTE — Telephone Encounter (Signed)
See message I entered in labs last night.

## 2013-10-08 NOTE — Telephone Encounter (Signed)
Form completed and in drawer. Pt notified on VM and of need for vaccine. Pt needs Hep B vaccine when he comes in and then filled in on form (then scan).

## 2013-11-12 ENCOUNTER — Ambulatory Visit (INDEPENDENT_AMBULATORY_CARE_PROVIDER_SITE_OTHER): Payer: Self-pay | Admitting: Physician Assistant

## 2013-11-12 DIAGNOSIS — Z9229 Personal history of other drug therapy: Secondary | ICD-10-CM

## 2013-11-12 DIAGNOSIS — Z9889 Other specified postprocedural states: Secondary | ICD-10-CM

## 2013-11-12 DIAGNOSIS — Z23 Encounter for immunization: Secondary | ICD-10-CM

## 2013-11-12 NOTE — Progress Notes (Signed)
Hep B immunization #2.

## 2013-11-12 NOTE — Patient Instructions (Signed)
Hepatitis B Vaccine: What You Need to Know  1. What is hepatitis B?  Hepatitis B is a serious infection that affects the liver. It is caused by the hepatitis B virus.   · In 2009, about 38,000 people became infected with hepatitis B.  · Each year about 2,000 to 4,000 people die in the [place] from cirrhosis or liver cancer caused by hepatitis B.  Hepatitis B can cause:  · Acute (short-term) illness. This can lead to:  ¨ loss of appetite  ¨ tiredness  ¨ pain in muscles, joints, and stomach  ¨ diarrhea and vomiting  ¨ jaundice (yellow skin or eyes)  Acute illness, with symptoms, is more common among adults. Children who become infected usually do not have symptoms.  · Chronic (long-term) infection. Some people go on to develop chronic hepatitis B infection. Most of them do not have symptoms, but the infection is still very serious, and can lead to:  ¨ liver damage (cirrhosis)  ¨ liver cancer  ¨ death  Chronic infection is more common among infants and children than among adults. People who are chronically infected can spread hepatitis B virus to others, even if they don't look or feel sick. Up to 1.4 million people in the [place] may have chronic hepatitis B infection.   Hepatitis B virus is easily spread through contact with the blood or other body fluids of an infected person. People can also be infected from contact with a contaminated object, where the virus can live for up to 7 days.  · A baby whose mother is infected can be infected at birth;  · Children, adolescents, and adults can become infected by:  ¨ contact with blood and body fluids through breaks in the skin such as bites, cuts, or sores;  ¨ contact with objects that have blood or body fluids on them such as toothbrushes, razors, or monitoring and treatment devices for diabetes;  ¨ having unprotected sex with an infected person;  ¨ sharing needles when injecting drugs;  ¨ being stuck with a used needle.  2. Hepatitis B vaccine: Why get  vaccinated?  Hepatitis B vaccine can prevent hepatitis B, and the serious consequences of hepatitis B infection, including liver cancer and cirrhosis.  Hepatitis B vaccine may be given by itself or in the same shot with other vaccines.  Routine hepatitis B vaccination was recommended for some [place] adults and children beginning in 1982, and for all children in 1991. Since 1990, new hepatitis B infections among children and adolescents have dropped by more than 95%--and by 75% in other age groups.  Vaccination gives long-term protection from hepatitis B infection, possibly lifelong.  3. Who should get hepatitis B vaccine and when?  Children and adolescents  · Babies normally get 3 doses of hepatitis B vaccine:  ¨ 1st Dose: Birth  ¨ 2nd Dose: 1-2 months of age  ¨ 3rd Dose: 6-18 months of age  Some babies might get 4 doses, for example, if a combination vaccine containing hepatitis B is used. (This is a single shot containing several vaccines.) The extra dose is not harmful.  · Anyone through 50 years of age who didn't get the vaccine when they were younger should also be vaccinated.  Adults  · All unvaccinated adults at risk for hepatitis B infection should be vaccinated. This includes:  ¨ sex partners of people infected with hepatitis B,  ¨ men who have sex with men,  ¨ people who inject street drugs,  ¨   people with more than one sex partner,  ¨ people with chronic liver or kidney disease,  ¨ people under 60 years of age with diabetes,  ¨ people with jobs that expose them to human blood or other body fluids,  ¨ household contacts of people infected with hepatitis B,  ¨ residents and staff in institutions for the developmentally disabled,  ¨ kidney dialysis patients,  ¨ people who travel to countries where hepatitis B is common,  ¨ people with HIV infection.  · Other people may be encouraged by their doctor to get hepatitis B vaccine; for example, adults 60 and older with diabetes. Anyone else who wants to be protected  from hepatitis B infection may get the vaccine.  · Pregnant women who are at risk for one of the reasons stated above should be vaccinated. Other pregnant women who want protection may be vaccinated.  Adults getting hepatitis B vaccine should get 3 doses--with the second dose given 4 weeks after the first and the third dose 5 months after the second. Your doctor can tell you about other dosing schedules that might be used in certain circumstances.  4. Who should not get hepatitis B vaccine?  · Anyone with a life-threatening allergy to yeast, or to any other component of the vaccine, should not get hepatitis B vaccine. Tell your doctor if you have any severe allergies.  · Anyone who has had a life-threatening allergic reaction to a previous dose of hepatitis B vaccine should not get another dose.  · Anyone who is moderately or severely ill when a dose of vaccine is scheduled should probably wait until they recover before getting the vaccine.  Your doctor can give you more information about these precautions.  Note: You might be asked to wait 28 days before donating blood after getting hepatitis B vaccine. This is because the screening test could mistake vaccine in the bloodstream (which is not infectious) for hepatitis B infection.  5. What are the risks from hepatitis B vaccine?  Hepatitis B is a very safe vaccine. Most people do not have any problems with it.  The vaccine contains non-infectious material, and cannot cause hepatitis B infection.  Some mild problems have been reported:  · Soreness where the shot was given (up to about 1 person in 4).  · Temperature of 99.9°F or higher (up to about 1 person in 15).  Severe problems are extremely rare. Severe allergic reactions are believed to occur about once in 1.1 million doses.  A vaccine, like any medicine, could cause a serious reaction. But the risk of a vaccine causing serious harm, or death, is extremely small. More than 100 million people in the [place]  have been vaccinated with hepatitis B vaccine.  6. What if there is a serious reaction?  What should I look for?  · Look for anything that concerns you, such as signs of a severe allergic reaction, very high fever, or behavior changes.  Signs of a severe allergic reaction can include hives, swelling of the face and throat, difficulty breathing, a fast heartbeat, dizziness, and weakness. These would start a few minutes to a few hours after the vaccination.  What should I do?  · If you think it is a severe allergic reaction or other emergency that can't wait, call 9-1-1 or get the person to the nearest hospital. Otherwise, call your doctor.  · Afterward, the reaction should be reported to the Vaccine Adverse Event Reporting System (VAERS). Your doctor might file   this report, or you can do it yourself through the VAERS web site at www.vaers.hhs.gov, or by calling 1-800-822-7967.  VAERS is only for reporting reactions. They do not give medical advice.  7. The National Vaccine Injury Compensation Program  The National Vaccine Injury Compensation Program (VICP) is a federal program that was created to compensate people who may have been injured by certain vaccines.  Persons who believe they may have been injured by a vaccine can learn about the program and about filing a claim by calling 1-800-338-2382 or visiting the VICP website at www.hrsa.gov/vaccinecompensation.  8. How can I learn more?  · Ask your doctor.  · Call your local or state health department.  · Contact the Centers for Disease Control and Prevention (CDC):  ¨ Call 1-800-232-4636 (1-800-CDC-INFO) or  ¨ Visit CDC's website at www.cdc.gov/vaccines  CDC Hepatitis B Interim VIS (02/02/10)  Document Released: 10/12/2005 Document Revised: 05/04/2013 Document Reviewed: 01/29/2013  Exit[employer]® Patient Information ©2015 [employer]. This information is not intended to replace advice given to you by your health care provider. Make sure you discuss any questions you  have with your health care provider.

## 2013-11-17 ENCOUNTER — Encounter: Payer: Self-pay | Admitting: Emergency Medicine

## 2014-03-03 ENCOUNTER — Telehealth: Payer: Self-pay

## 2014-03-03 NOTE — Telephone Encounter (Signed)
Can you check into this Erin?

## 2014-03-03 NOTE — Telephone Encounter (Signed)
Pt came in and states his insurance is requesting a letter of explaination for labs done on 09-24-13. He seen Dr Dareen PianoAnderson. He can be reached @ [phone] when ready Thank you

## 2014-03-05 NOTE — Telephone Encounter (Signed)
Left message on machine to call back  

## 2014-03-09 NOTE — Telephone Encounter (Signed)
Left message on machine to call back  

## 2014-03-10 NOTE — Telephone Encounter (Signed)
Pt would like a copy of his labs mailed to him. Sent

## 2014-10-07 IMAGING — CR DG CHEST 2V
2 series · 2 of 2 positions shown · non-contrast
Comparison: None.

CLINICAL DATA: Shortness of breath, history of treated tuberculosis
in 2220

CHEST - 2 VIEW

[PA]
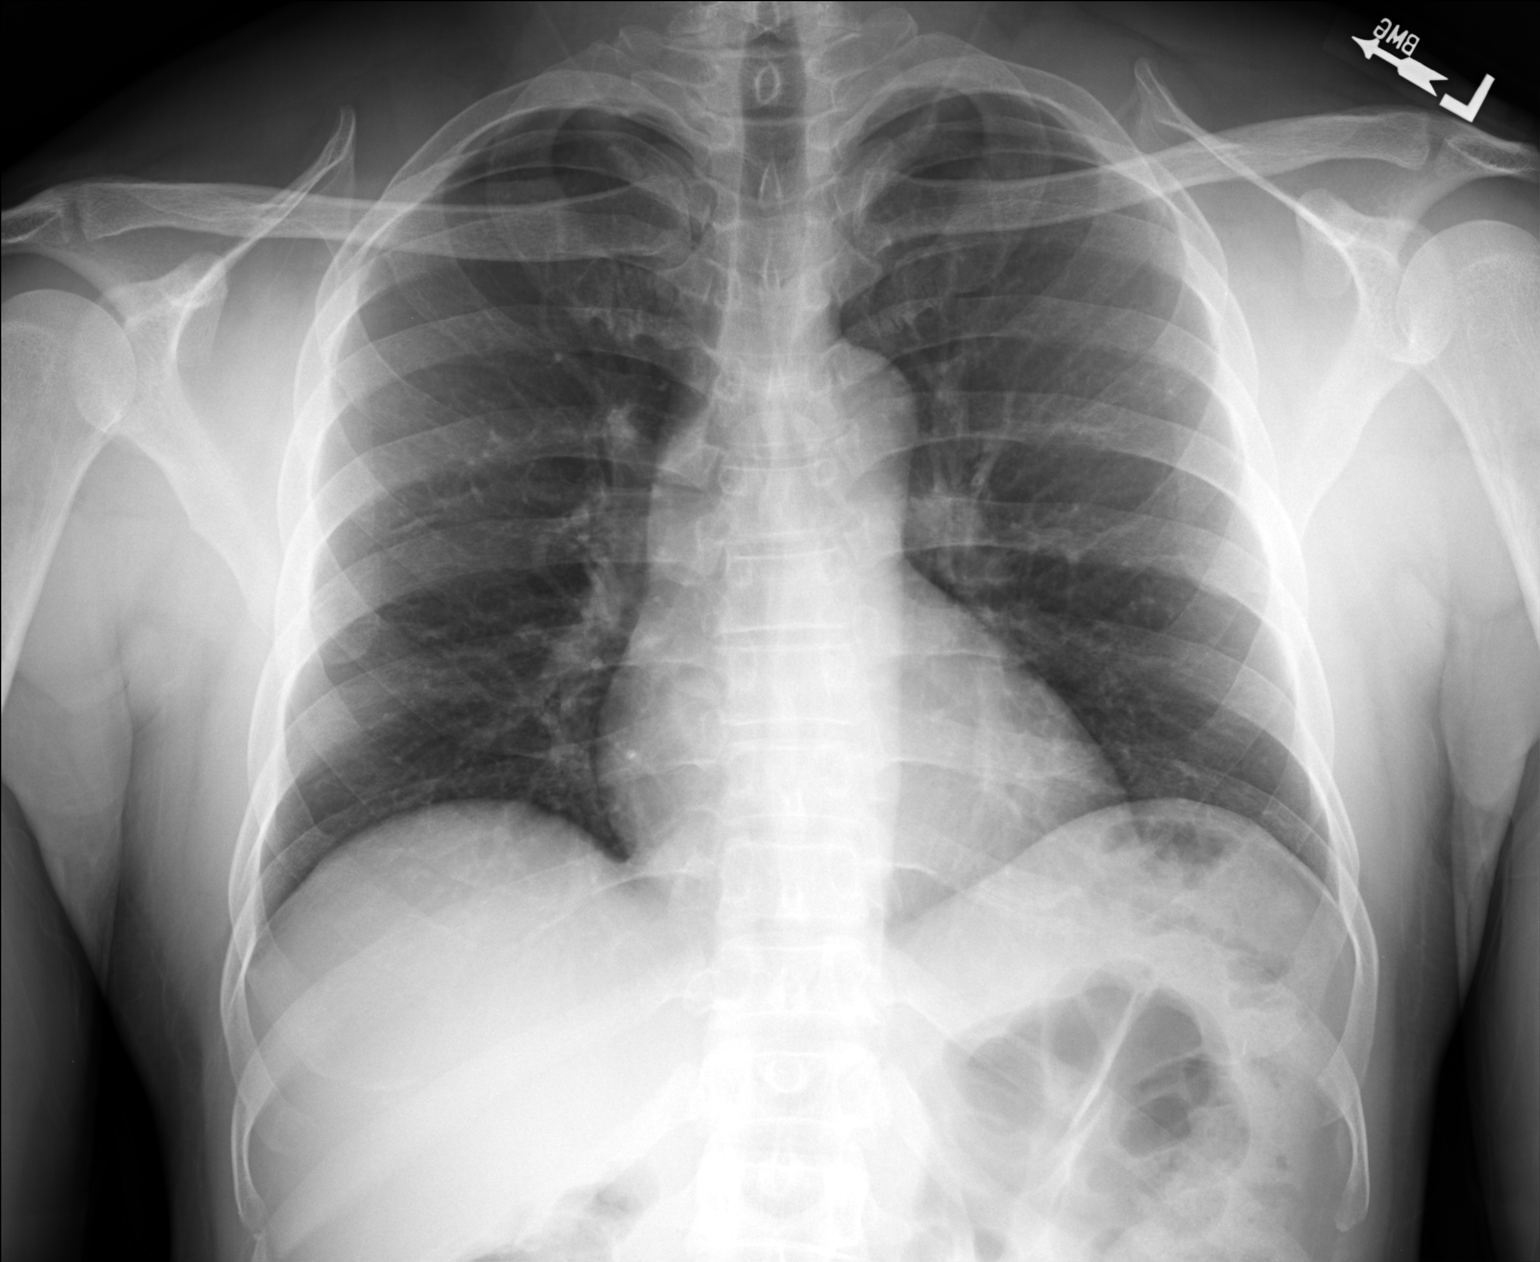

[lateral]
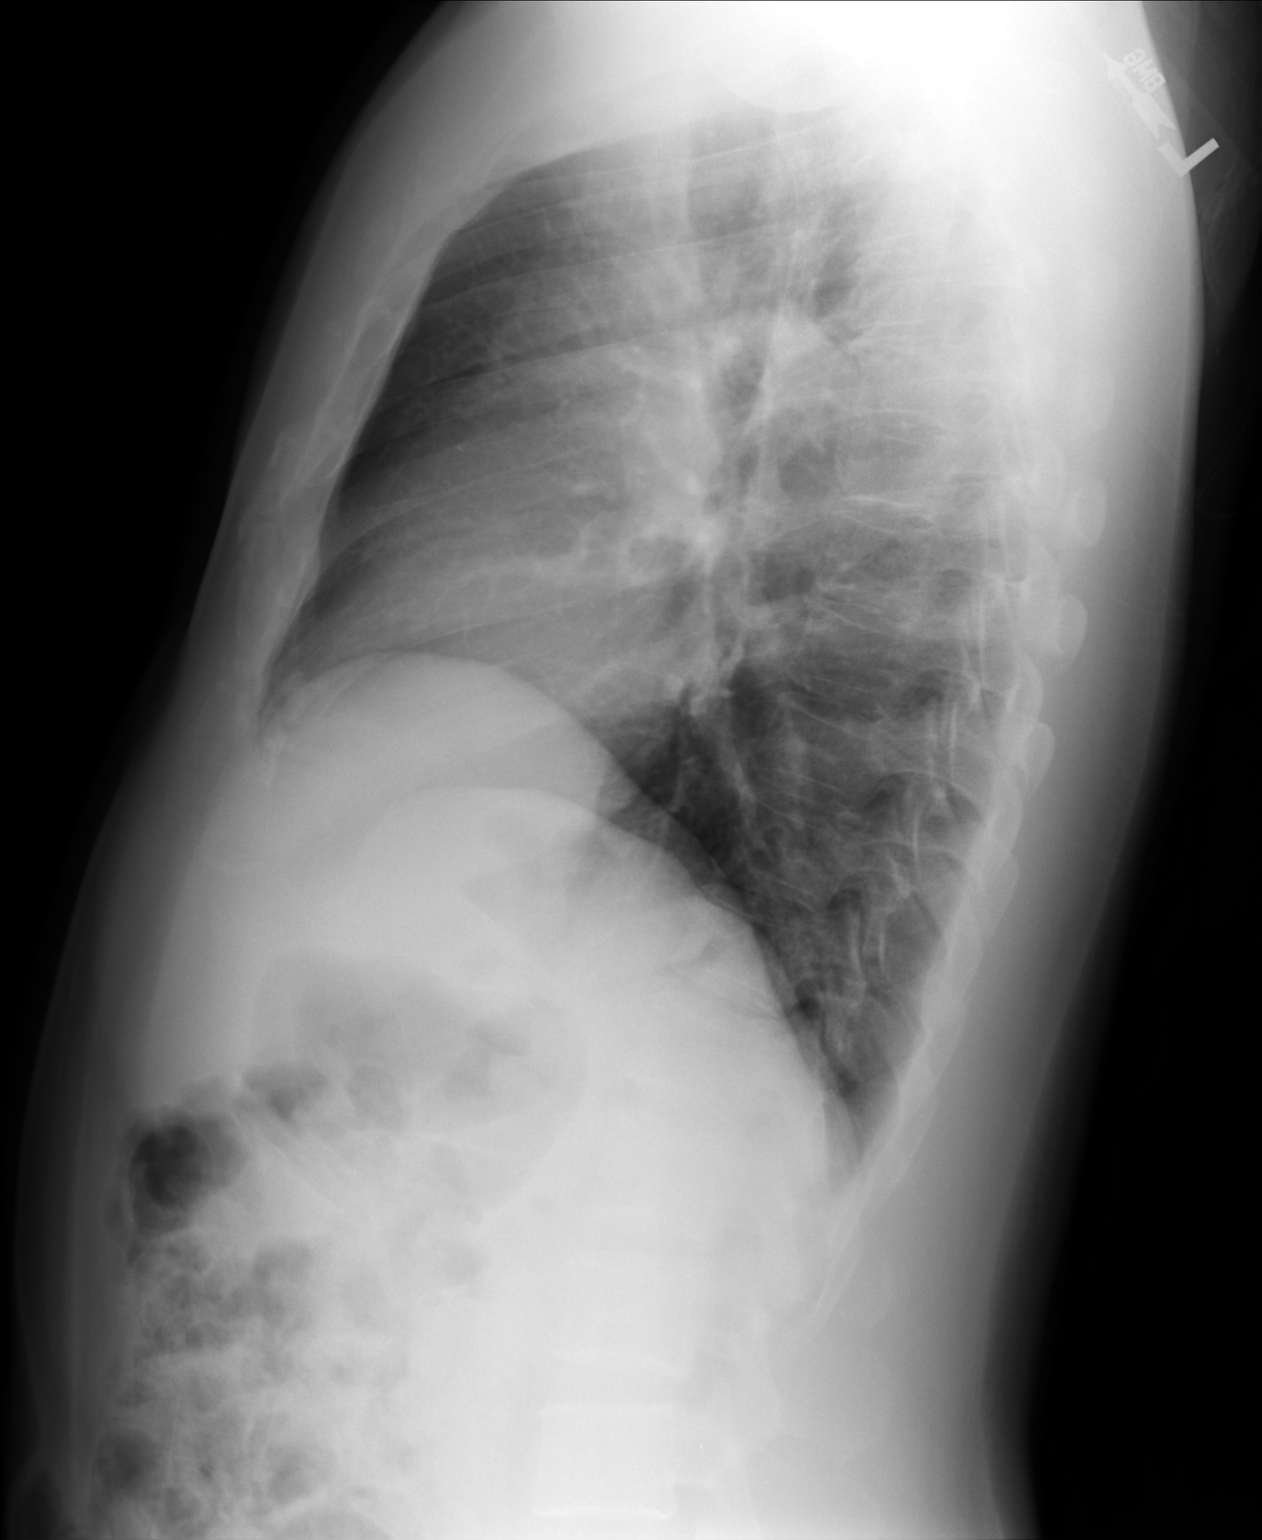

[2 of 2 positions shown; findings below may reference images not displayed]

FINDINGS: Presumed apical/upper lobe granulomata.  Heart size is
normal.  No focal pulmonary parenchymal consolidation or mass.  No
pleural effusion.  No acute osseous finding.
IMPRESSION: 2-3 mm bilateral upper lobe pulmonary nodules, presumably
granulomata, although comparison to prior exams would be helpful to
determine chronicity and.  Given the paucity of these findings,
this is much less likely to represent miliary tuberculosis in an
immunocompetent patient.

Clinically significant discrepancy from primary report, if
provided: None

## 2016-02-08 IMAGING — CR DG CHEST 1V
1 series · 1 of 1 positions shown · non-contrast
Comparison: Two-view chest 06/03/2012.

CLINICAL DATA: Positive PPD. Initial encounter. Personal history of
treated tuberculosis.

EXAM:
CHEST - 1 VIEW

[PA]
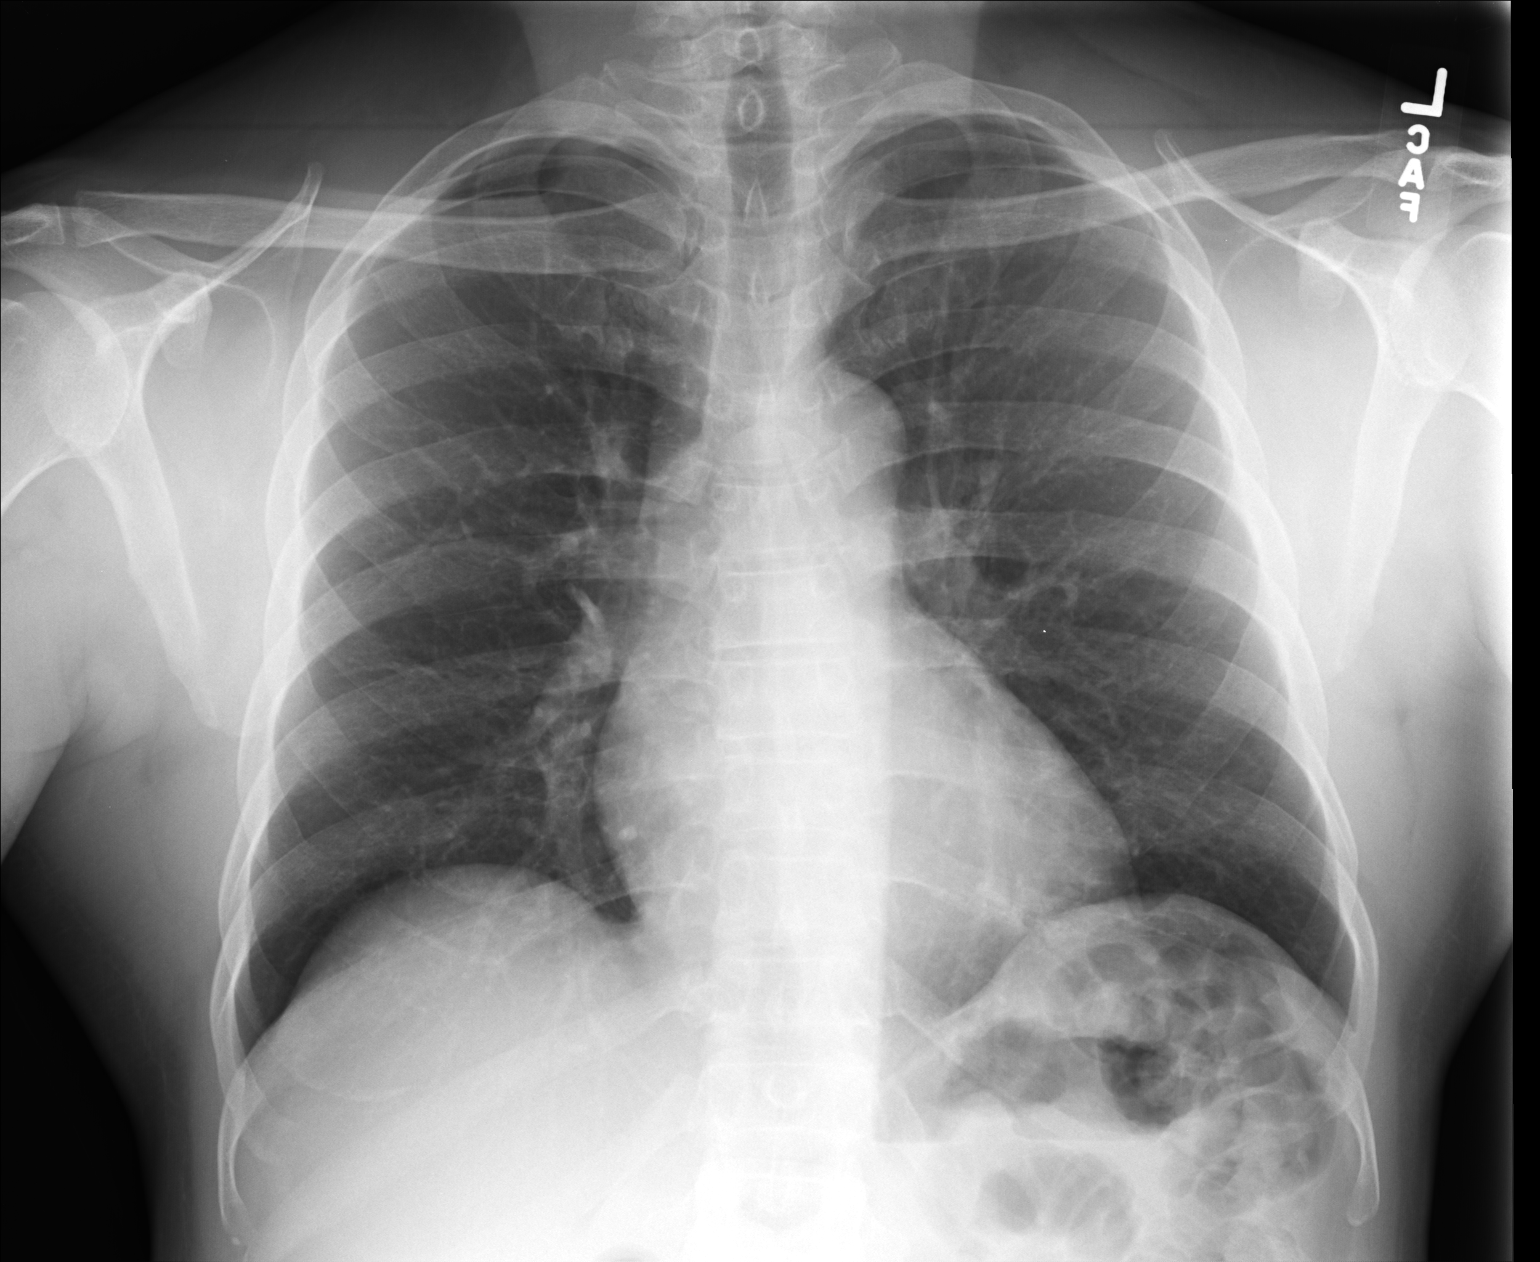

[1 of 1 positions shown; findings below may reference images not displayed]

FINDINGS: The heart size is normal. Granulomatous changes are stable. The
lungs are otherwise clear. The visualized soft tissues and bony
thorax are unremarkable.
IMPRESSION: No acute cardiopulmonary disease or significant interval change.

## 2016-04-24 ENCOUNTER — Ambulatory Visit (INDEPENDENT_AMBULATORY_CARE_PROVIDER_SITE_OTHER): Payer: BLUE CROSS/BLUE SHIELD | Admitting: Urgent Care

## 2016-04-24 ENCOUNTER — Encounter (INDEPENDENT_AMBULATORY_CARE_PROVIDER_SITE_OTHER): Payer: Self-pay

## 2016-04-24 VITALS — BP 138/78 | HR 66 | Temp 98.0°F | Resp 17 | Ht 63.5 in | Wt 162.0 lb

## 2016-04-24 DIAGNOSIS — Z8042 Family history of malignant neoplasm of prostate: Secondary | ICD-10-CM | POA: Diagnosis not present

## 2016-04-24 DIAGNOSIS — L309 Dermatitis, unspecified: Secondary | ICD-10-CM | POA: Diagnosis not present

## 2016-04-24 DIAGNOSIS — Z13220 Encounter for screening for lipoid disorders: Secondary | ICD-10-CM

## 2016-04-24 DIAGNOSIS — Z8249 Family history of ischemic heart disease and other diseases of the circulatory system: Secondary | ICD-10-CM

## 2016-04-24 DIAGNOSIS — Z114 Encounter for screening for human immunodeficiency virus [HIV]: Secondary | ICD-10-CM

## 2016-04-24 DIAGNOSIS — J302 Other seasonal allergic rhinitis: Secondary | ICD-10-CM

## 2016-04-24 DIAGNOSIS — Z833 Family history of diabetes mellitus: Secondary | ICD-10-CM | POA: Diagnosis not present

## 2016-04-24 DIAGNOSIS — R350 Frequency of micturition: Secondary | ICD-10-CM

## 2016-04-24 DIAGNOSIS — Z131 Encounter for screening for diabetes mellitus: Secondary | ICD-10-CM

## 2016-04-24 DIAGNOSIS — Z0000 Encounter for general adult medical examination without abnormal findings: Secondary | ICD-10-CM

## 2016-04-24 MED ORDER — FLUTICASONE PROPIONATE 50 MCG/ACT NA SUSP: 2.0000 | Freq: Every day | NASAL | 12 refills | Status: AC

## 2016-04-24 MED ORDER — TRIAMCINOLONE ACETONIDE 0.1 % EX CREA: 1.0000 "application " | TOPICAL_CREAM | Freq: Two times a day (BID) | CUTANEOUS | 0 refills | Status: AC

## 2016-04-24 MED ORDER — CETIRIZINE HCL 10 MG PO TABS: 10.0000 mg | ORAL_TABLET | Freq: Every day | ORAL | 3 refills | Status: AC

## 2016-04-24 MED ORDER — PSEUDOEPHEDRINE HCL ER 120 MG PO TB12
120.0000 mg | ORAL_TABLET | Freq: Two times a day (BID) | ORAL | 3 refills | Status: AC
Start: 2016-04-24 — End: ?

## 2016-04-24 NOTE — Patient Instructions (Addendum)
Eczema Eczema, also called atopic dermatitis, is a skin disorder that causes inflammation of the skin. It causes a red rash and dry, scaly skin. The skin becomes very itchy. Eczema is generally worse during the cooler winter months and often improves with the warmth of summer. Eczema usually starts showing signs in infancy. Some children outgrow eczema, but it may last through adulthood. What are the causes? The exact cause of eczema is not known, but it appears to run in families. People with eczema often have a family history of eczema, allergies, asthma, or hay fever. Eczema is not contagious. Flare-ups of the condition may be caused by:  Contact with something you are sensitive or allergic to.  Stress. What are the signs or symptoms?  Dry, scaly skin.  Red, itchy rash.  Itchiness. This may occur before the skin rash and may be very intense. How is this diagnosed? The diagnosis of eczema is usually made based on symptoms and medical history. How is this treated? Eczema cannot be cured, but symptoms usually can be controlled with treatment and other strategies. A treatment plan might include:  Controlling the itching and scratching.  Use over-the-counter antihistamines as directed for itching. This is especially useful at night when the itching tends to be worse.  Use over-the-counter steroid creams as directed for itching.  Avoid scratching. Scratching makes the rash and itching worse. It may also result in a skin infection (impetigo) due to a break in the skin caused by scratching.  Keeping the skin well moisturized with creams every day. This will seal in moisture and help prevent dryness. Lotions that contain alcohol and water should be avoided because they can dry the skin.  Limiting exposure to things that you are sensitive or allergic to (allergens).  Recognizing situations that cause stress.  Developing a plan to manage stress. Follow these instructions at home:  Only  take over-the-counter or prescription medicines as directed by your health care provider.  Do not use anything on the skin without checking with your health care provider.  Keep baths or showers short (5 minutes) in warm (not hot) water. Use mild cleansers for bathing. These should be unscented. You may add nonperfumed bath oil to the bath water. It is best to avoid soap and bubble bath.  Immediately after a bath or shower, when the skin is still damp, apply a moisturizing ointment to the entire body. This ointment should be a petroleum ointment. This will seal in moisture and help prevent dryness. The thicker the ointment, the better. These should be unscented.  Keep fingernails cut short. Children with eczema may need to wear soft gloves or mittens at night after applying an ointment.  Dress in clothes made of cotton or cotton blends. Dress lightly, because heat increases itching.  A child with eczema should stay away from anyone with fever blisters or cold sores. The virus that causes fever blisters (herpes simplex) can cause a serious skin infection in children with eczema. Contact a health care provider if:  Your itching interferes with sleep.  Your rash gets worse or is not better within 1 week after starting treatment.  You see pus or soft yellow scabs in the rash area.  You have a fever.  You have a rash flare-up after contact with someone who has fever blisters. This information is not intended to replace advice given to you by your health care provider. Make sure you discuss any questions you have with your health care provider. Document Released:  12/16/1999 Document Revised: 05/26/2015 Document Reviewed: 07/21/2012 Elsevier Interactive Patient Education  2017 [employer]   Allergies, Adult An allergy is when your body's defense system (immune system) overreacts to an otherwise harmless substance (allergen) that you breathe in or eat or something that touches your skin.  When you come into contact with something that you are allergic to, your immune system produces certain proteins (antibodies). These proteins cause cells to release chemicals (histamines) that trigger the symptoms of an allergic reaction. Allergies often affect the nasal passages (allergic rhinitis), eyes (allergic conjunctivitis), skin (atopic dermatitis), and stomach. Allergies can be mild or severe. Allergies cannot spread from person to person (are not contagious). They can develop at any age and may be outgrown. What are the causes? Allergies can be caused by any substance that your immune system mistakenly targets as harmful. These may include:  Outdoor allergens, such as pollen, grass, weeds, car exhaust, and mold spores.  Indoor allergens, such as dust, smoke, mold, and pet dander.  Foods, especially peanuts, milk, eggs, fish, shellfish, soy, nuts, and wheat.  Medicines, such as penicillin.  Skin irritants, such as detergents, chemicals, and latex.  Perfume.  Insect bites or stings. What increases the risk? You may be at greater risk of allergies if other people in your family have allergies. What are the signs or symptoms? Symptoms depend on what type of allergy you have. They may include:  Runny, stuffy nose.  Sneezing.  Itchy mouth, ears, or throat.  Postnasal drip.  Sore throat.  Itchy, red, watery, or puffy eyes.  Skin rash or hives.  Stomach pain.  Vomiting.  Diarrhea.  Bloating.  Wheezing or coughing. People with a severe allergy to food, medicine, or an insect bite may have a life-threatening allergic reaction (anaphylaxis). Symptoms of anaphylaxis include:  Hives.  Itching.  Flushed face.  Swollen lips, tongue, or mouth.  Tight or swollen throat.  Chest pain or tightness in the chest.  Trouble breathing or shortness of breath.  Rapid heartbeat.  Dizziness or fainting.  Vomiting.  Diarrhea.  Pain in the abdomen. How is this  diagnosed? This condition is diagnosed based on:  Your symptoms.  Your family and medical history.  A physical exam. You may need to see a health care provider who specializes in treating allergies (allergist). You may also have tests, including:  Skin tests to see which allergens are causing your symptoms, such as:  Skin prick test. In this test, your skin is pricked with a tiny needle and exposed to small amounts of possible allergens to see if your skin reacts.  Intradermal skin test. In this test, a small amount of allergen is injected under your skin to see if your skin reacts.  Patch test. In this test, a small amount of allergen is placed on your skin and then your skin is covered with a bandage. Your health care provider will check your skin after a couple of days to see if a rash has developed.  Blood tests.  Challenges tests. In this test, you inhale a small amount of allergen by mouth to see if you have an allergic reaction. You may also be asked to:  Keep a food diary. A food diary is a record of all the foods and drinks you have in a day and any symptoms you experience.  Practice an elimination diet. An elimination diet involves eliminating specific foods from your diet and then adding them back in one by one to find out if  a certain food causes an allergic reaction. How is this treated? Treatment for allergies depends on your symptoms. Treatment may include:  Cold compresses to soothe itching and swelling.  Eye drops.  Nasal sprays.  Using a saline spray or container (neti pot) to flush out the nose (nasal irrigation). These methods can help clear away mucus and keep the nasal passages moist.  Using a humidifier.  Oral antihistamines or other medicines to block allergic reaction and inflammation.  Skin creams to treat rashes or itching.  Diet changes to eliminate food allergy triggers.  Repeated exposure to tiny amounts of allergens to build up a tolerance  and prevent future allergic reactions (immunotherapy). These include:  Allergy shots.  Oral treatment. This involves taking small doses of an allergen under the tongue (sublingual immunotherapy).  Emergency epinephrine injection (auto-injector) in case of an allergic emergency. This is a self-injectable, pre-measured medicine that must be given within the first few minutes of a serious allergic reaction. Follow these instructions at home:  Avoid known allergens whenever possible.  If you suffer from airborne allergens, wash out your nose daily. You can do this with a saline spray or a neti pot to flush out your nose (nasal irrigation).  Take over-the-counter and prescription medicines only as told by your health care provider.  Keep all follow-up visits as told by your health care provider. This is important.  If you are at risk of a severe allergic reaction (anaphylaxis), keep your auto-injector with you at all times.  If you have ever had anaphylaxis, wear a medical alert bracelet or necklace that states you have a severe allergy. Contact a health care provider if:  Your symptoms do not improve with treatment. Get help right away if:  You have symptoms of anaphylaxis, such as:  Swollen mouth, tongue, or throat.  Pain or tightness in your chest.  Trouble breathing or shortness of breath.  Dizziness or fainting.  Severe abdominal pain, vomiting, or diarrhea. This information is not intended to replace advice given to you by your health care provider. Make sure you discuss any questions you have with your health care provider. Document Released: 03/13/2002 Document Revised: 08/18/2015 Document Reviewed: 07/06/2015 Elsevier Interactive Patient Education  2017 [employer].   Keeping you healthy  Get these tests  Blood pressure- Have your blood pressure checked once a year by your healthcare provider.  Normal blood pressure is 120/80  Weight- Have your body mass index  (BMI) calculated to screen for obesity.  BMI is a measure of body fat based on height and weight. You can also calculate your own BMI at [email or web address].  Cholesterol- Have your cholesterol checked every year.  Diabetes- Have your blood sugar checked regularly if you have high blood pressure, high cholesterol, have a family history of diabetes or if you are overweight.  Screening for Colon Cancer- Colonoscopy starting at age 20.  Screening may begin sooner depending on your family history and other health conditions. Follow up colonoscopy as directed by your Gastroenterologist.  Screening for Prostate Cancer- Both blood work (PSA) and a rectal exam help screen for Prostate Cancer.  Screening begins at age 43 with African-American men and at age 77 with Caucasian men.  Screening may begin sooner depending on your family history.  Take these medicines  Aspirin- One aspirin daily can help prevent Heart disease and Stroke.  Flu shot- Every fall.  Tetanus- Every 10 years.  Zostavax- Once after the age of 63 to prevent Shingles.  Pneumonia  shot- Once after the age of 23; if you are younger than 54, ask your healthcare provider if you need a Pneumonia shot.  Take these steps  Don't smoke- If you do smoke, talk to your doctor about quitting.  For tips on how to quit, go to www.smokefree.gov or call 1-800-QUIT-NOW.  Be physically active- Exercise 5 days a week for at least 30 minutes.  If you are not already physically active start slow and gradually work up to 30 minutes of moderate physical activity.  Examples of moderate activity include walking briskly, mowing the yard, dancing, swimming, bicycling, etc.  Eat a healthy diet- Eat a variety of healthy food such as fruits, vegetables, low fat milk, low fat cheese, yogurt, lean meant, poultry, fish, beans, tofu, etc. For more information go to www.thenutritionsource.org  Drink alcohol in moderation- Limit alcohol intake to less than two  drinks a day. Never drink and drive.  Dentist- Brush and floss twice daily; visit your dentist twice a year.  Depression- Your emotional health is as important as your physical health. If you're feeling down, or losing interest in things you would normally enjoy please talk to your healthcare provider.  Eye exam- Visit your eye doctor every year.  Safe sex- If you may be exposed to a sexually transmitted infection, use a condom.  Seat belts- Seat belts can save your life; always wear one.  Smoke/Carbon Monoxide detectors- These detectors need to be installed on the appropriate level of your home.  Replace batteries at least once a year.  Skin cancer- When out in the sun, cover up and use sunscreen 15 SPF or higher.  Violence- If anyone is threatening you, please tell your healthcare provider.  Living Will/ Health care power of attorney- Speak with your healthcare provider and family.    IF you received an x-ray today, you will receive an invoice from [facility]. Please contact [facility] at [phone] with questions or concerns regarding your invoice.   IF you received labwork today, you will receive an invoice from [place]. Please contact [facility] at [phone] with questions or concerns regarding your invoice.   Our billing staff will not be able to assist you with questions regarding bills from these companies.  You will be contacted with the lab results as soon as they are available. The fastest way to get your results is to activate your My Chart account. Instructions are located on the last page of this paperwork. If you have not heard from [place] regarding the results in 2 weeks, please contact this office.

## 2016-04-24 NOTE — Progress Notes (Signed)
MRN: [id number]  Subjective:   Mr. Hermenegildo Clausen is a 51 y.o. male presenting for annual physical exam. Patient is married, has 3 young children. Has good relationships at home, has a good support network. 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 Denies smoking cigarettes or drinking alcohol. Patient tries to eat healthily. Does not actively exercise.   Medical care team includes: PCP: No PCP Per Patient Vision: Wears glasses, had his last eye exam 2 years ago. Dental: Dental cleanings every 6 months. Has not been back in 1 year because of cost burden. He had insurance but it did not help much. Specialists: None.   Health Maintenance: Patient has never had a colonoscopy.   Rash - Reports several year history of intermittent itchy dry rash over his neck. Denies fever, drainage, pain, bleeding. Has been relieved with an otc steroid cream.   Urinary Frequency - Reports difficulty starting to urinate. Denies fever, dysuria, hematuria, penile discharge, penile swelling, testicular pain, testicular swelling, anal pain, groin pain. Family history of prostate cancer as below.  Savva has a current medication list which includes the following prescription(s): cetirizine and valacyclovir. He has No Known Allergies. Antrell  has a past medical history of Allergy and Sinus infection. Also  has a past surgical history that includes Orchiopexy (Left). His  family history includes Diabetes in his mother. His father had prostate cancer in his early 58's and passed away at 58.   Immunizations: Tdap is up to date since 2015.  Review of Systems  Constitutional: Negative for chills, diaphoresis, fever, malaise/fatigue and weight loss.  HENT: Positive for congestion. Negative for ear discharge, ear pain, hearing loss, nosebleeds, sore throat and tinnitus.   Eyes: Negative for blurred vision, double vision, photophobia, pain, discharge and redness.  Respiratory: Negative for cough, shortness  of breath and wheezing.   Cardiovascular: Negative for chest pain, palpitations and leg swelling.  Gastrointestinal: Negative for abdominal pain, blood in stool, constipation, diarrhea, nausea and vomiting.  Genitourinary: Positive for frequency (can also have difficulty starting to urinate). Negative for dysuria, flank pain, hematuria and urgency.  Musculoskeletal: Negative for back pain, joint pain and myalgias.  Skin: Positive for rash (intermittent dry, scaly rash over left posterior neck). Negative for itching.  Neurological: Negative for dizziness, tingling, seizures, loss of consciousness, weakness and headaches.  Endo/Heme/Allergies: Positive for environmental allergies. Negative for polydipsia.  Psychiatric/Behavioral: Negative for depression, hallucinations, memory loss, substance abuse and suicidal ideas. The patient is not nervous/anxious and does not have insomnia.     Objective:   Vitals: BP 138/78   Pulse 66   Temp 98 F (36.7 C) (Oral)   Resp 17   Ht 5' 3.5" (1.613 m)   Wt 162 lb (73.5 kg)   SpO2 97%   BMI 28.25 kg/m    Visual Acuity Screening   Right eye Left eye Both eyes  Without correction:     With correction:    Physical Exam  Constitutional: He is oriented to person, place, and time. He appears well-developed and well-nourished.  HENT:  TM's flat bilaterally but no effusions or erythema. Nasal turbinates boggy and violaceous, nasal passages minimally-moderately patent, without sinus tenderness.  Postnasal drip present but without oropharyngeal exudates, erythema or abscesses.   Eyes: Conjunctivae and EOM are normal. Pupils are equal, round, and reactive to light. Right eye exhibits no discharge. Left eye exhibits no discharge. No scleral icterus.  Neck: Normal range of motion. Neck supple.  No thyromegaly present.  Cardiovascular: Normal rate, regular rhythm and intact distal pulses.  Exam reveals no gallop and no friction rub.   No  murmur heard. Pulmonary/Chest: No stridor. No respiratory distress. He has no wheezes. He has no rales.  Abdominal: Soft. Bowel sounds are normal. He exhibits no distension and no mass. There is no tenderness.  Musculoskeletal: Normal range of motion. He exhibits no edema or tenderness.  Lymphadenopathy:    He has no cervical adenopathy.  Neurological: He is alert and oriented to person, place, and time. He has normal reflexes.  Skin: Skin is warm and dry. No rash noted. No erythema. No pallor.  Psychiatric: He has a normal mood and affect.   Assessment and Plan :   1. Annual physical exam - Patient is very pleasant person. Medically healthy, labs pending. Discussed healthy lifestyle, diet, exercise, preventative care, vaccinations, and addressed patient's concerns.  - CBC - Comprehensive metabolic panel - Lipid panel - TSH - HIV antibody  2. Family history of diabetes mellitus 3. Family history of hypertension 4. Family history of prostate cancer in father - Labs pending  5. Seasonal allergic rhinitis, unspecified trigger - Restart allergy medications, use Sudafed PRN - fluticasone (FLONASE) 50 MCG/ACT nasal spray; Place 2 sprays into both nostrils daily.  Dispense: 16 g; Refill: 12 - cetirizine (ZYRTEC) 10 MG tablet; Take 1 tablet (10 mg total) by mouth daily.  Dispense: 90 tablet; Refill: 3  6. Screening for diabetes mellitus 7. Screening for hyperlipidemia 8. Screening for HIV (human immunodeficiency virus) - Labs pending  9. Urinary frequency - Labs pending  10. Eczema, unspecified type - Counseled on use of Kenalog, rtc if no improvement after 4 weeks. - triamcinolone cream (KENALOG) 0.1 %; Apply 1 application topically 2 (two) times daily.  Dispense: 30 g; Refill: 0   Wallis Bamberg, PA-C [facility] [phone] 04/24/2016  9:16 AM

## 2016-04-25 LAB — LIPID PANEL
CHOL/HDL RATIO: 3.2 ratio (ref 0.0–5.0)
Cholesterol, Total: 144 mg/dL (ref 100–199)
HDL: 45 mg/dL (ref >39)
LDL Calculated: 67 mg/dL (ref 0–99)
TRIGLYCERIDES: 161 mg/dL — AB (ref 0–149)
VLDL Cholesterol Cal: 32 mg/dL (ref 5–40)

## 2016-04-25 LAB — COMPREHENSIVE METABOLIC PANEL
ALBUMIN: 4.6 g/dL (ref 3.5–5.5)
ALK PHOS: 68 IU/L (ref 39–117)
ALT: 37 IU/L (ref 0–44)
AST: 32 IU/L (ref 0–40)
Albumin/Globulin Ratio: 1.6 (ref 1.2–2.2)
BILIRUBIN TOTAL: 0.3 mg/dL (ref 0.0–1.2)
BUN / CREAT RATIO: 8 — AB (ref 9–20)
BUN: 7 mg/dL (ref 6–24)
CHLORIDE: 101 mmol/L (ref 96–106)
CO2: 26 mmol/L (ref 18–29)
Calcium: 10.2 mg/dL (ref 8.7–10.2)
Creatinine, Ser: 0.84 mg/dL (ref 0.76–1.27)
GFR calc Af Amer: 116 mL/min/{1.73_m2} (ref >59)
GFR calc non Af Amer: 101 mL/min/{1.73_m2} (ref >59)
GLUCOSE: 96 mg/dL (ref 65–99)
Globulin, Total: 2.8 g/dL (ref 1.5–4.5)
Potassium: 4.7 mmol/L (ref 3.5–5.2)
Sodium: 142 mmol/L (ref 134–144)
Total Protein: 7.4 g/dL (ref 6.0–8.5)

## 2016-04-25 LAB — CBC
Hematocrit: 44.3 % (ref 37.5–51.0)
Hemoglobin: 14.8 g/dL (ref 13.0–17.7)
MCH: 25.5 pg — ABNORMAL LOW (ref 26.6–33.0)
MCHC: 33.4 g/dL (ref 31.5–35.7)
MCV: 76 fL — ABNORMAL LOW (ref 79–97)
PLATELETS: 187 10*3/uL (ref 150–379)
RBC: 5.81 x10E6/uL — AB (ref 4.14–5.80)
RDW: 14.9 % (ref 12.3–15.4)
WBC: 4.6 10*3/uL (ref 3.4–10.8)

## 2016-04-25 LAB — HEMOGLOBIN A1C
ESTIMATED AVERAGE GLUCOSE: 131 mg/dL
HEMOGLOBIN A1C: 6.2 % — AB (ref 4.8–5.6)

## 2016-04-25 LAB — PSA: Prostate Specific Ag, Serum: 0.6 ng/mL (ref 0.0–4.0)

## 2016-04-25 LAB — HIV ANTIBODY (ROUTINE TESTING W REFLEX): HIV Screen 4th Generation wRfx: NONREACTIVE

## 2016-04-25 LAB — TSH: TSH: 4.08 u[IU]/mL (ref 0.450–4.500)
